# Patient Record
Sex: Female | Born: 1960 | Race: Black or African American | Hispanic: No | Marital: Married | Smoking: Never smoker
Health system: Southern US, Community
[De-identification: ages and names within clinical notes are randomized; demographics above are authoritative.]

## PROBLEM LIST (undated history)

## (undated) DIAGNOSIS — E119 Type 2 diabetes mellitus without complications: Secondary | ICD-10-CM

## (undated) DIAGNOSIS — N6310 Unspecified lump in the right breast, unspecified quadrant: Secondary | ICD-10-CM

## (undated) DIAGNOSIS — I10 Essential (primary) hypertension: Secondary | ICD-10-CM

## (undated) HISTORY — PX: TUBAL LIGATION: SHX77

## (undated) HISTORY — PX: NOVASURE ABLATION: SHX5394

---

## 2018-01-09 ENCOUNTER — Other Ambulatory Visit: Payer: Self-pay | Admitting: Nurse Practitioner

## 2018-01-09 DIAGNOSIS — R928 Other abnormal and inconclusive findings on diagnostic imaging of breast: Secondary | ICD-10-CM

## 2018-01-14 ENCOUNTER — Other Ambulatory Visit: Payer: Self-pay | Admitting: Nurse Practitioner

## 2018-01-15 ENCOUNTER — Other Ambulatory Visit: Payer: Self-pay | Admitting: Nurse Practitioner

## 2018-01-15 DIAGNOSIS — N6489 Other specified disorders of breast: Secondary | ICD-10-CM

## 2018-01-15 DIAGNOSIS — R928 Other abnormal and inconclusive findings on diagnostic imaging of breast: Secondary | ICD-10-CM

## 2018-07-07 ENCOUNTER — Other Ambulatory Visit: Payer: Self-pay | Admitting: Nurse Practitioner

## 2018-07-07 ENCOUNTER — Ambulatory Visit
Admission: RE | Admit: 2018-07-07 | Discharge: 2018-07-07 | Disposition: A | Discharge status: Home / Self Care | Payer: BLUE CROSS/BLUE SHIELD | Source: Ambulatory Visit | Attending: Nurse Practitioner | Admitting: Nurse Practitioner

## 2018-07-07 DIAGNOSIS — N6310 Unspecified lump in the right breast, unspecified quadrant: Secondary | ICD-10-CM

## 2018-07-07 DIAGNOSIS — N6489 Other specified disorders of breast: Secondary | ICD-10-CM

## 2018-07-07 DIAGNOSIS — R928 Other abnormal and inconclusive findings on diagnostic imaging of breast: Secondary | ICD-10-CM

## 2018-07-11 ENCOUNTER — Ambulatory Visit
Admission: RE | Admit: 2018-07-11 | Discharge: 2018-07-11 | Disposition: A | Discharge status: Home / Self Care | Payer: BLUE CROSS/BLUE SHIELD | Source: Ambulatory Visit | Attending: Nurse Practitioner | Admitting: Nurse Practitioner

## 2018-07-11 DIAGNOSIS — N6310 Unspecified lump in the right breast, unspecified quadrant: Secondary | ICD-10-CM

## 2018-08-13 ENCOUNTER — Ambulatory Visit: Payer: Self-pay | Admitting: Surgery

## 2018-08-13 DIAGNOSIS — N6310 Unspecified lump in the right breast, unspecified quadrant: Secondary | ICD-10-CM

## 2018-08-13 NOTE — H&P (Signed)
Daryel November Documented: 08/13/2018 10:41 AM Location: [facility] Patient #: [id number] DOB: [birth year] Married / Language: English / Race: Black or African American Female  History of Present Illness Marcello Moores A. Deana Krock MD; 08/13/2018 11:16 AM) Patient words: Patient sent at the request of Dr. Shelly Bombard of the Breast Ctr[facility] due to a discordant biopsy. The patient had a density in her right breast upper quadrant and followed the last 6 months and [place]. She was sent here for 6 month follow-up in the areas biopsied by ultrasound guidance which showed benign breast tissue but this was felt to be discordant by the radiologist. The patient denies any history of breast pain, nipple discharge or breast mass bilaterally. She has a sister with breast cancer and uterine cancer in her brother with pancreatic cancer she states. Her father died at a very young age she does not history.                ADDENDUM: Pathology results: Pathology results from the ultrasound-guided biopsy of the suspicious mass in the right breast at the 1 o'clock position revealed benign breast tissue. This is considered discordant with the imaging findings.  The patient has been notified of the results. She is doing well and denies any biopsy site complications.  Given the suspicious appearance of the mass in the right breast, surgical excision of this site is recommended. She is scheduled to see Dr. Brantley Stage 07/28/2018 at 10:10 a.m. The patient has been instructed to call the [facility] with any questions or concerns.   Electronically Signed By: Everlean Alstrom M.D. On: 07/14/2018 12:43      Diagnosis Breast, right, needle core biopsy, 1 o'clock - BENIGN BREAST TISSUE. Microscopic Comment A lesion is not seen. The case was called to [facility] on 07/14/2018. Vicente Males MD Pathologist, Electronic Signature (Case signed 07/14/2018) Specimen  Gross and Clinical Information Specimen Comment Time in formalin: 3500; right breast mass Specimen(s) Obtained: Breast, right, needle core biopsy, 1 o'clock Specimen Clinical           58 year old patient presents for follow-up of the right breast. Prior images from [place] including a 2D mammogram and right breast ultrasound dated June 2019.  EXAM: DIGITAL DIAGNOSTIC RIGHT MAMMOGRAM WITH CAD AND TOMO  ULTRASOUND RIGHT BREAST  COMPARISON: June 2019 from [place]  ACR Breast Density Category b: There are scattered areas of fibroglandular density.  FINDINGS: Whole breast CC and MLO views with tomography are performed. There is a focal mass with associated architectural distortion in the upper inner right breast, with adjacent nodularity. In total, this area measures 2 cm.  Mammographic images were processed with CAD.  On physical exam, no mass is palpated in the 1 o'clock position the right breast 7 cm from the nipple.  Targeted ultrasound is performed, showing a heterogeneously hypoechoic irregular mass at 1 o'clock position 7 cm from the nipple measuring 0.6 x 0.8 x 1.2 cm. Ultrasound of the right axilla is negative for lymphadenopathy.  IMPRESSION: Irregular mass with distortion in the 1 o'clock position of the right breast.  RECOMMENDATION: Ultrasound-guided core needle biopsy is recommended and has been scheduled for the patient this Friday July 11, 2018.  I have discussed the findings and recommendations with the patient. Results were also provided in writing at the conclusion of the visit. If applicable, a reminder letter will be sent to the patient regarding the next appointment.  BI-RADS CATEGORY 4: Suspicious.   Electronically Signed By:  Curlene Dolphin M.D. On: 07/07/2018 12:53.  The patient is a 58 year old female.   Past Surgical History Sabino Gasser, [place]; 08/13/2018 10:41 AM) Breast Biopsy  Bilateral. Cesarean Section - 1  Diagnostic Studies History Sabino Gasser, CMA; 08/13/2018 10:41 AM) Colonoscopy 1-5 years ago Mammogram within last year Pap Smear 1-5 years ago  Allergies Sabino Gasser, [place]; 08/13/2018 10:42 AM) Alvina Filbert *ANALGESICS - OPIOID* Allergies Reconciled  Medication History Sabino Gasser, CMA; 08/13/2018 10:42 AM) amLODIPine Besy-Benazepril HCl (10-40MG  Capsule, Oral) Active. Janumet XR (100-1000MG  Tablet ER 24HR, Oral) Active. Medications Reconciled  Social History Sabino Gasser, CMA; 08/13/2018 10:41 AM) Alcohol use Occasional alcohol use. Caffeine use Carbonated beverages, Coffee. No drug use Tobacco use Never smoker.  Family History Sabino Gasser, [place]; 08/13/2018 10:41 AM) Arthritis Mother, Sister. Breast Cancer Sister. Cervical Cancer Sister. Colon Polyps Sister. Depression Sister. Diabetes Mellitus Mother, Sister. Heart Disease Mother. Heart disease in female family member before age 70 Hypertension Brother, Daughter, Mother, Sister, Son. Malignant Neoplasm Of Pancreas Sister.  Pregnancy / Birth History Sabino Gasser, CMA; 08/13/2018 10:41 AM) Age at menarche 31 years. Age of menopause <47 Contraceptive History Oral contraceptives. Gravida 4 Maternal age 67-20 Para 3  Other Problems Sabino Gasser, [place]; 08/13/2018 10:41 AM) Diabetes Mellitus High blood pressure     Review of Systems Sabino Gasser CMA; 08/13/2018 10:41 AM) General Not Present- Appetite Loss, Chills, Fatigue, Fever, Night Sweats, Weight Gain and Weight Loss. Skin Not Present- Change in Wart/Mole, Dryness, Hives, Jaundice, New Lesions, Non-Healing Wounds, Rash and Ulcer. HEENT Present- Wears glasses/contact lenses. Not Present- Earache, Hearing Loss, Hoarseness, Nose Bleed, Oral Ulcers, Ringing in the Ears, Seasonal Allergies, Sinus Pain, Sore Throat, Visual Disturbances and Yellow Eyes. Respiratory Not Present- Bloody sputum, Chronic  Cough, Difficulty Breathing, Snoring and Wheezing. Breast Not Present- Breast Mass, Breast Pain, Nipple Discharge and Skin Changes. Cardiovascular Not Present- Chest Pain, Difficulty Breathing Lying Down, Leg Cramps, Palpitations, Rapid Heart Rate, Shortness of Breath and Swelling of Extremities. Gastrointestinal Not Present- Abdominal Pain, Bloating, Bloody Stool, Change in Bowel Habits, Chronic diarrhea, Constipation, Difficulty Swallowing, Excessive gas, Gets full quickly at meals, Hemorrhoids, Indigestion, Nausea, Rectal Pain and Vomiting. Female Genitourinary Not Present- Frequency, Nocturia, Painful Urination, Pelvic Pain and Urgency. Musculoskeletal Not Present- Back Pain, Joint Pain, Joint Stiffness, Muscle Pain, Muscle Weakness and Swelling of Extremities. Neurological Not Present- Decreased Memory, Fainting, Headaches, Numbness, Seizures, Tingling, Tremor, Trouble walking and Weakness. Psychiatric Not Present- Anxiety, Bipolar, Change in Sleep Pattern, Depression, Fearful and Frequent crying. Endocrine Present- Hot flashes. Not Present- Cold Intolerance, Excessive Hunger, Hair Changes, Heat Intolerance and New Diabetes. Hematology Present- Easy Bruising. Not Present- Blood Thinners, Excessive bleeding, Gland problems, HIV and Persistent Infections.  Vitals Sabino Gasser CMA; 08/13/2018 10:43 AM) 08/13/2018 10:42 AM Weight: 207 lb Height: 62in Body Surface Area: 1.94 m Body Mass Index: 37.86 kg/m  Temp.: 98.41F(Oral)  Pulse: 100 (Regular)  BP: 134/82 (Sitting, Left Arm, Standard)      Physical Exam (Khalaya Mcgurn A. Blayre Papania MD; 08/13/2018 11:16 AM)  General Mental Status-Alert. General Appearance-Consistent with stated age. Hydration-Well hydrated. Voice-Normal.  Head and Neck Head-normocephalic, atraumatic with no lesions or palpable masses. Trachea-midline. Thyroid Gland Characteristics - normal size and consistency.  Chest and Lung Exam Chest and  lung exam reveals -quiet, even and easy respiratory effort with no use of accessory muscles and on auscultation, normal breath sounds, no adventitious sounds and normal vocal resonance. Inspection Chest Wall - Normal. Back - normal.  Breast Breast - Left-Symmetric, Non Tender, No  Biopsy scars, no Dimpling, No Inflammation, No Lumpectomy scars, No Mastectomy scars, No Peau d' Orange. Breast - Right-Symmetric, Non Tender, No Biopsy scars, no Dimpling, No Inflammation, No Lumpectomy scars, No Mastectomy scars, No Peau d' Orange. Breast Lump-No Palpable Breast Mass.  Neurologic Neurologic evaluation reveals -alert and oriented x 3 with no impairment of recent or remote memory. Mental Status-Normal.  Musculoskeletal Normal Exam - Left-Upper Extremity Strength Normal and Lower Extremity Strength Normal. Normal Exam - Right-Upper Extremity Strength Normal and Lower Extremity Strength Normal.  Lymphatic Head & Neck  General Head & Neck Lymphatics: Bilateral - Description - Normal. Axillary  General Axillary Region: Bilateral - Description - Normal. Tenderness - Non Tender.    Assessment & Plan (Ermine Stebbins A. Jentzen Minasyan MD; 08/13/2018 11:17 AM)  SOFT TISSUE MASS (M79.89) Impression: discordant right breast on mammogram Discussed observation in 6 months versus right breast localized lumpectomy.  She opted for lumpectomy.    Risk of lumpectomy include bleeding, infection, seroma, more surgery, use of seed/wire, wound care, cosmetic deformity and the need for other treatments, death , blood clots, death. Pt agrees to proceed. discordant right breast mass  Current Plans Referred to Genetic Counseling, for evaluation and follow up (Medical Genetics). Routine. You are being scheduled for surgery- Our schedulers will call you.  You should hear from our office's scheduling department within 5 working days about the location, date, and time of surgery. We try to make  accommodations for patient's preferences in scheduling surgery, but sometimes the OR schedule or the surgeon's schedule prevents [place] from making those accommodations.  If you have not heard from our office [phone]) in 5 working days, call the office and ask for your surgeon's nurse.  If you have other questions about your diagnosis, plan, or surgery, call the office and ask for your surgeon's nurse.  Pt Education - [facility] Breast Biopsy HCI: discussed with patient and provided information.

## 2018-08-13 NOTE — H&P (View-Only) (Signed)
Cindy May Documented: 08/13/2018 10:41 AM Location: [facility] Patient #: [id number] DOB: 1960-12-07 Married / Language: English / Race: Black or African American Female  History of Present Illness Cindy Moores A. Deverick Pruss May; 08/13/2018 11:16 AM) Patient words: Patient sent at the request of Cindy May of the Breast Ctr[facility] due to a discordant biopsy. The patient had a density in her right breast upper quadrant and followed the last 6 months and [place]. She was sent here for 6 month follow-up in the areas biopsied by ultrasound guidance which showed benign breast tissue but this was felt to be discordant by the radiologist. The patient denies any history of breast pain, nipple discharge or breast mass bilaterally. She has a sister with breast cancer and uterine cancer in her brother with pancreatic cancer she states. Her father died at a very young age she does not history.                ADDENDUM: Pathology results: Pathology results from the ultrasound-guided biopsy of the suspicious mass in the right breast at the 1 o'clock position revealed benign breast tissue. This is considered discordant with the imaging findings.  The patient has been notified of the results. She is doing well and denies any biopsy site complications.  Given the suspicious appearance of the mass in the right breast, surgical excision of this site is recommended. She is scheduled to see Cindy May at 10:10 a.m. The patient has been instructed to call the [facility] with any questions or concerns.   Electronically Signed By: Cindy Alstrom M.D. On: 07/14/2018 12:43      Diagnosis Breast, right, needle core biopsy, 1 o'clock - BENIGN BREAST TISSUE. Microscopic Comment A lesion is not seen. The case was called to [facility] on 07/14/2018. Cindy May Pathologist, Electronic Signature (Case signed 07/14/2018) Specimen  Gross and Clinical Information Specimen Comment Time in formalin: 6160; right breast mass Specimen(s) Obtained: Breast, right, needle core biopsy, 1 o'clock Specimen Clinical           58 year old patient presents for follow-up of the right breast. Prior images from [place] including a 2D mammogram and right breast ultrasound dated June 2019.  EXAM: DIGITAL DIAGNOSTIC RIGHT MAMMOGRAM WITH CAD AND TOMO  ULTRASOUND RIGHT BREAST  COMPARISON: June 2019 from [place]  ACR Breast Density Category b: There are scattered areas of fibroglandular density.  FINDINGS: Whole breast CC and MLO views with tomography are performed. There is a focal mass with associated architectural distortion in the upper inner right breast, with adjacent nodularity. In total, this area measures 2 cm.  Mammographic images were processed with CAD.  On physical exam, no mass is palpated in the 1 o'clock position the right breast 7 cm from the nipple.  Targeted ultrasound is performed, showing a heterogeneously hypoechoic irregular mass at 1 o'clock position 7 cm from the nipple measuring 0.6 x 0.8 x 1.2 cm. Ultrasound of the right axilla is negative for lymphadenopathy.  IMPRESSION: Irregular mass with distortion in the 1 o'clock position of the right breast.  RECOMMENDATION: Ultrasound-guided core needle biopsy is recommended and has been scheduled for the patient this Friday July 11, 2018.  I have discussed the findings and recommendations with the patient. Results were also provided in writing at the conclusion of the visit. If applicable, a reminder letter will be sent to the patient regarding the next appointment.  BI-RADS CATEGORY 4: Suspicious.   Electronically Signed By:  Cindy Dolphin M.D. On: 07/07/2018 12:53.  The patient is a 58 year old female.   Past Surgical History Cindy May; 08/13/2018 10:41 AM) Breast Biopsy  Bilateral. Cesarean Section - 1  Diagnostic Studies History Cindy May; 08/13/2018 10:41 AM) Colonoscopy 1-5 years ago Mammogram within last year Pap Smear 1-5 years ago  Allergies Cindy May; 08/13/2018 10:42 AM) Alvina Filbert *ANALGESICS - OPIOID* Allergies Reconciled  Medication History Cindy May; 08/13/2018 10:42 AM) amLODIPine Besy-Benazepril HCl (10-40MG  Capsule, Oral) Active. Janumet XR (100-1000MG  Tablet ER 24HR, Oral) Active. Medications Reconciled  Social History Cindy May; 08/13/2018 10:41 AM) Alcohol use Occasional alcohol use. Caffeine use Carbonated beverages, Coffee. No drug use Tobacco use Never smoker.  Family History Cindy May; 08/13/2018 10:41 AM) Arthritis Mother, Sister. Breast Cancer Sister. Cervical Cancer Sister. Colon Polyps Sister. Depression Sister. Diabetes Mellitus Mother, Sister. Heart Disease Mother. Heart disease in female family member before age 51 Hypertension Brother, Daughter, Mother, Sister, Son. Malignant Neoplasm Of Pancreas Sister.  Pregnancy / Birth History Cindy May; 08/13/2018 10:41 AM) Age at menarche 70 years. Age of menopause <47 Contraceptive History Oral contraceptives. Gravida 4 Maternal age 35-20 Para 3  Other Problems Cindy May; 08/13/2018 10:41 AM) Diabetes Mellitus High blood pressure     Review of Systems Cindy Gasser May; 08/13/2018 10:41 AM) General Not Present- Appetite Loss, Chills, Fatigue, Fever, Night Sweats, Weight Gain and Weight Loss. Skin Not Present- Change in Wart/Mole, Dryness, Hives, Jaundice, New Lesions, Non-Healing Wounds, Rash and Ulcer. HEENT Present- Wears glasses/contact lenses. Not Present- Earache, Hearing Loss, Hoarseness, Nose Bleed, Oral Ulcers, Ringing in the Ears, Seasonal Allergies, Sinus Pain, Sore Throat, Visual Disturbances and Yellow Eyes. Respiratory Not Present- Bloody sputum, Chronic  Cough, Difficulty Breathing, Snoring and Wheezing. Breast Not Present- Breast Mass, Breast Pain, Nipple Discharge and Skin Changes. Cardiovascular Not Present- Chest Pain, Difficulty Breathing Lying Down, Leg Cramps, Palpitations, Rapid Heart Rate, Shortness of Breath and Swelling of Extremities. Gastrointestinal Not Present- Abdominal Pain, Bloating, Bloody Stool, Change in Bowel Habits, Chronic diarrhea, Constipation, Difficulty Swallowing, Excessive gas, Gets full quickly at meals, Hemorrhoids, Indigestion, Nausea, Rectal Pain and Vomiting. Female Genitourinary Not Present- Frequency, Nocturia, Painful Urination, Pelvic Pain and Urgency. Musculoskeletal Not Present- Back Pain, Joint Pain, Joint Stiffness, Muscle Pain, Muscle Weakness and Swelling of Extremities. Neurological Not Present- Decreased Memory, Fainting, Headaches, Numbness, Seizures, Tingling, Tremor, Trouble walking and Weakness. Psychiatric Not Present- Anxiety, Bipolar, Change in Sleep Pattern, Depression, Fearful and Frequent crying. Endocrine Present- Hot flashes. Not Present- Cold Intolerance, Excessive Hunger, Hair Changes, Heat Intolerance and New Diabetes. Hematology Present- Easy Bruising. Not Present- Blood Thinners, Excessive bleeding, Gland problems, HIV and Persistent Infections.  Vitals Cindy Gasser May; 08/13/2018 10:43 AM) 08/13/2018 10:42 AM Weight: 207 lb Height: 62in Body Surface Area: 1.94 m Body Mass Index: 37.86 kg/m  Temp.: 98.49F(Oral)  Pulse: 100 (Regular)  BP: 134/82 (Sitting, Left Arm, Standard)      Physical Exam (Yulisa Chirico A. Chrishon Martino May; 08/13/2018 11:16 AM)  General Mental Status-Alert. General Appearance-Consistent with stated age. Hydration-Well hydrated. Voice-Normal.  Head and Neck Head-normocephalic, atraumatic with no lesions or palpable masses. Trachea-midline. Thyroid Gland Characteristics - normal size and consistency.  Chest and Lung Exam Chest and  lung exam reveals -quiet, even and easy respiratory effort with no use of accessory muscles and on auscultation, normal breath sounds, no adventitious sounds and normal vocal resonance. Inspection Chest Wall - Normal. Back - normal.  Breast Breast - Left-Symmetric, Non Tender, No  Biopsy scars, no Dimpling, No Inflammation, No Lumpectomy scars, No Mastectomy scars, No Peau d' Orange. Breast - Right-Symmetric, Non Tender, No Biopsy scars, no Dimpling, No Inflammation, No Lumpectomy scars, No Mastectomy scars, No Peau d' Orange. Breast Lump-No Palpable Breast Mass.  Neurologic Neurologic evaluation reveals -alert and oriented x 3 with no impairment of recent or remote memory. Mental Status-Normal.  Musculoskeletal Normal Exam - Left-Upper Extremity Strength Normal and Lower Extremity Strength Normal. Normal Exam - Right-Upper Extremity Strength Normal and Lower Extremity Strength Normal.  Lymphatic Head & Neck  General Head & Neck Lymphatics: Bilateral - Description - Normal. Axillary  General Axillary Region: Bilateral - Description - Normal. Tenderness - Non Tender.    Assessment & Plan (Briyonna Omara A. Orlander [place] May; 08/13/2018 11:17 AM)  SOFT TISSUE MASS (M79.89) Impression: discordant right breast on mammogram Discussed observation in 6 months versus right breast localized lumpectomy.  She opted for lumpectomy.    Risk of lumpectomy include bleeding, infection, seroma, more surgery, use of seed/wire, wound care, cosmetic deformity and the need for other treatments, death , blood clots, death. Pt agrees to proceed. discordant right breast mass  Current Plans Referred to Genetic Counseling, for evaluation and follow up (Medical Genetics). Routine. You are being scheduled for surgery- Our schedulers will call you.  You should hear from our office's scheduling department within 5 working days about the location, date, and time of surgery. We try to make  accommodations for patient's preferences in scheduling surgery, but sometimes the OR schedule or the surgeon's schedule prevents [place] from making those accommodations.  If you have not heard from our office [phone]) in 5 working days, call the office and ask for your surgeon's nurse.  If you have other questions about your diagnosis, plan, or surgery, call the office and ask for your surgeon's nurse.  Pt Education - [facility] Breast Biopsy HCI: discussed with patient and provided information.

## 2018-08-14 ENCOUNTER — Other Ambulatory Visit: Payer: Self-pay | Admitting: Surgery

## 2018-08-14 DIAGNOSIS — N6310 Unspecified lump in the right breast, unspecified quadrant: Secondary | ICD-10-CM

## 2018-09-01 ENCOUNTER — Encounter (HOSPITAL_BASED_OUTPATIENT_CLINIC_OR_DEPARTMENT_OTHER): Payer: Self-pay | Admitting: *Deleted

## 2018-09-01 ENCOUNTER — Other Ambulatory Visit: Payer: Self-pay

## 2018-09-02 ENCOUNTER — Encounter (HOSPITAL_BASED_OUTPATIENT_CLINIC_OR_DEPARTMENT_OTHER)
Admission: RE | Admit: 2018-09-02 | Discharge: 2018-09-02 | Disposition: A | Discharge status: Home / Self Care | Payer: BLUE CROSS/BLUE SHIELD | Source: Ambulatory Visit | Attending: Surgery | Admitting: Surgery

## 2018-09-02 DIAGNOSIS — Z01818 Encounter for other preprocedural examination: Secondary | ICD-10-CM | POA: Insufficient documentation

## 2018-09-02 LAB — BASIC METABOLIC PANEL
Anion gap: 9 (ref 5–15)
BUN: 8 mg/dL (ref 6–20)
CHLORIDE: 107 mmol/L (ref 98–111)
CO2: 25 mmol/L (ref 22–32)
Calcium: 9.5 mg/dL (ref 8.9–10.3)
Creatinine, Ser: 1 mg/dL (ref 0.44–1.00)
GFR calc Af Amer: >60 mL/min (ref >60)
GFR calc non Af Amer: >60 mL/min (ref >60)
Glucose, Bld: 101 mg/dL — ABNORMAL HIGH (ref 70–99)
Potassium: 4 mmol/L (ref 3.5–5.1)
Sodium: 141 mmol/L (ref 135–145)

## 2018-09-02 NOTE — Progress Notes (Signed)
EKG reviewed with Dr Tobias Alexander. Ok to proceed with surgery as scheduled. Ensure pre surgical drink given to pt with instructions to finish by 0415 DOS. Hibiclens soap given to pt with instructions. Verbalized understanding.

## 2018-09-08 ENCOUNTER — Ambulatory Visit
Admission: RE | Admit: 2018-09-08 | Discharge: 2018-09-08 | Disposition: A | Discharge status: Home / Self Care | Payer: BLUE CROSS/BLUE SHIELD | Source: Ambulatory Visit | Attending: Surgery | Admitting: Surgery

## 2018-09-08 DIAGNOSIS — N6310 Unspecified lump in the right breast, unspecified quadrant: Secondary | ICD-10-CM

## 2018-09-08 NOTE — Anesthesia Preprocedure Evaluation (Addendum)
Anesthesia Evaluation  Patient identified by MRN, date of birth, ID band Patient awake    Reviewed: Allergy & Precautions, NPO status , Patient's Chart, lab work & pertinent test results  History of Anesthesia Complications Negative for: history of anesthetic complications  Airway Mallampati: II  TM Distance: >3 FB Neck ROM: Full    Dental  (+) Dental Advisory Given, Partial Upper   Pulmonary neg pulmonary ROS,    Pulmonary exam normal breath sounds clear to auscultation       Cardiovascular hypertension, Pt. on medications Normal cardiovascular exam Rhythm:Regular Rate:Normal     Neuro/Psych negative neurological ROS     GI/Hepatic negative GI ROS, Neg liver ROS,   Endo/Other  diabetes, Type 2, Oral Hypoglycemic Agents  Renal/GU negative Renal ROS     Musculoskeletal negative musculoskeletal ROS (+)   Abdominal   Peds  Hematology negative hematology ROS (+)   Anesthesia Other Findings Day of surgery medications reviewed with the patient.  Reproductive/Obstetrics                            Anesthesia Physical Anesthesia Plan  ASA: III  Anesthesia Plan: General   Post-op Pain Management:    Induction: Intravenous  PONV Risk Score and Plan: 3 and Treatment may vary due to age or medical condition, Ondansetron, Dexamethasone and Midazolam  Airway Management Planned: LMA  Additional Equipment:   Intra-op Plan:   Post-operative Plan: Extubation in OR  Informed Consent: I have reviewed the patients History and Physical, chart, labs and discussed the procedure including the risks, benefits and alternatives for the proposed anesthesia with the patient or authorized representative who has indicated his/her understanding and acceptance.     Dental advisory given  Plan Discussed with: CRNA  Anesthesia Plan Comments:        Anesthesia Quick Evaluation

## 2018-09-09 ENCOUNTER — Encounter (HOSPITAL_BASED_OUTPATIENT_CLINIC_OR_DEPARTMENT_OTHER): Payer: Self-pay | Admitting: *Deleted

## 2018-09-09 ENCOUNTER — Ambulatory Visit (HOSPITAL_BASED_OUTPATIENT_CLINIC_OR_DEPARTMENT_OTHER): Payer: BLUE CROSS/BLUE SHIELD | Admitting: Anesthesiology

## 2018-09-09 ENCOUNTER — Encounter (HOSPITAL_BASED_OUTPATIENT_CLINIC_OR_DEPARTMENT_OTHER)
Admission: RE | Disposition: A | Discharge status: Home / Self Care | Payer: Self-pay | Source: Home / Self Care | Attending: Surgery

## 2018-09-09 ENCOUNTER — Ambulatory Visit (HOSPITAL_BASED_OUTPATIENT_CLINIC_OR_DEPARTMENT_OTHER)
Admission: RE | Admit: 2018-09-09 | Discharge: 2018-09-09 | Disposition: A | Discharge status: Home / Self Care | Payer: BLUE CROSS/BLUE SHIELD | Attending: Surgery | Admitting: Surgery

## 2018-09-09 ENCOUNTER — Other Ambulatory Visit: Payer: Self-pay

## 2018-09-09 ENCOUNTER — Ambulatory Visit
Admission: RE | Admit: 2018-09-09 | Discharge: 2018-09-09 | Disposition: A | Discharge status: Home / Self Care | Payer: BLUE CROSS/BLUE SHIELD | Source: Ambulatory Visit | Attending: Surgery | Admitting: Surgery

## 2018-09-09 DIAGNOSIS — E119 Type 2 diabetes mellitus without complications: Secondary | ICD-10-CM | POA: Diagnosis not present

## 2018-09-09 DIAGNOSIS — N6011 Diffuse cystic mastopathy of right breast: Secondary | ICD-10-CM | POA: Insufficient documentation

## 2018-09-09 DIAGNOSIS — I10 Essential (primary) hypertension: Secondary | ICD-10-CM | POA: Diagnosis not present

## 2018-09-09 DIAGNOSIS — N6310 Unspecified lump in the right breast, unspecified quadrant: Secondary | ICD-10-CM

## 2018-09-09 DIAGNOSIS — Z79899 Other long term (current) drug therapy: Secondary | ICD-10-CM | POA: Insufficient documentation

## 2018-09-09 DIAGNOSIS — D241 Benign neoplasm of right breast: Secondary | ICD-10-CM | POA: Insufficient documentation

## 2018-09-09 DIAGNOSIS — Z885 Allergy status to narcotic agent status: Secondary | ICD-10-CM | POA: Diagnosis not present

## 2018-09-09 DIAGNOSIS — Z7984 Long term (current) use of oral hypoglycemic drugs: Secondary | ICD-10-CM | POA: Insufficient documentation

## 2018-09-09 DIAGNOSIS — N6312 Unspecified lump in the right breast, upper inner quadrant: Secondary | ICD-10-CM | POA: Diagnosis present

## 2018-09-09 HISTORY — DX: Essential (primary) hypertension: I10

## 2018-09-09 HISTORY — DX: Unspecified lump in the right breast, unspecified quadrant: N63.10

## 2018-09-09 HISTORY — PX: BREAST LUMPECTOMY WITH RADIOACTIVE SEED LOCALIZATION: SHX6424

## 2018-09-09 HISTORY — DX: Type 2 diabetes mellitus without complications: E11.9

## 2018-09-09 LAB — GLUCOSE, CAPILLARY
Glucose-Capillary: 124 mg/dL — ABNORMAL HIGH (ref 70–99)
Glucose-Capillary: 105 mg/dL — ABNORMAL HIGH (ref 70–99)

## 2018-09-09 SURGERY — BREAST LUMPECTOMY WITH RADIOACTIVE SEED LOCALIZATION
Anesthesia: General | Site: Breast | Laterality: Right

## 2018-09-09 MED ORDER — DEXAMETHASONE SODIUM PHOSPHATE 10 MG/ML IJ SOLN
INTRAMUSCULAR | Status: DC | PRN
  Administered 2018-09-09: 4 mg via INTRAVENOUS

## 2018-09-09 MED ORDER — CHLORHEXIDINE GLUCONATE CLOTH 2 % EX PADS: 6.0000 | MEDICATED_PAD | Freq: Once | CUTANEOUS | Status: DC

## 2018-09-09 MED ORDER — PROPOFOL 10 MG/ML IV BOLUS
INTRAVENOUS | Status: DC | PRN
  Administered 2018-09-09: 180 mg via INTRAVENOUS

## 2018-09-09 MED ORDER — ACETAMINOPHEN 500 MG PO TABS
1000.0000 mg | ORAL_TABLET | ORAL | Status: AC
  Administered 2018-09-09: 1000 mg via ORAL

## 2018-09-09 MED ORDER — GABAPENTIN 300 MG PO CAPS
ORAL_CAPSULE | ORAL | Status: AC
  Filled 2018-09-09: qty 1

## 2018-09-09 MED ORDER — ONDANSETRON HCL 4 MG/2ML IJ SOLN
INTRAMUSCULAR | Status: AC
  Filled 2018-09-09: qty 2

## 2018-09-09 MED ORDER — ACETAMINOPHEN 10 MG/ML IV SOLN: 1000.0000 mg | Freq: Once | INTRAVENOUS | Status: DC | PRN

## 2018-09-09 MED ORDER — CELECOXIB 200 MG PO CAPS
200.0000 mg | ORAL_CAPSULE | ORAL | Status: AC
  Administered 2018-09-09: 200 mg via ORAL

## 2018-09-09 MED ORDER — LIDOCAINE 2% (20 MG/ML) 5 ML SYRINGE
INTRAMUSCULAR | Status: AC
  Filled 2018-09-09: qty 5

## 2018-09-09 MED ORDER — FENTANYL CITRATE (PF) 100 MCG/2ML IJ SOLN
INTRAMUSCULAR | Status: AC
  Filled 2018-09-09: qty 2

## 2018-09-09 MED ORDER — MIDAZOLAM HCL 2 MG/2ML IJ SOLN
1.0000 mg | INTRAMUSCULAR | Status: DC | PRN
  Administered 2018-09-09: 2 mg via INTRAVENOUS

## 2018-09-09 MED ORDER — OXYCODONE HCL 5 MG/5ML PO SOLN: 5.0000 mg | Freq: Once | ORAL | Status: DC | PRN

## 2018-09-09 MED ORDER — ACETAMINOPHEN 500 MG PO TABS
ORAL_TABLET | ORAL | Status: AC
  Filled 2018-09-09: qty 2

## 2018-09-09 MED ORDER — DEXTROSE 5 % IV SOLN: 3.0000 g | INTRAVENOUS | Status: DC

## 2018-09-09 MED ORDER — CELECOXIB 200 MG PO CAPS
ORAL_CAPSULE | ORAL | Status: AC
  Filled 2018-09-09: qty 1

## 2018-09-09 MED ORDER — OXYCODONE HCL 5 MG PO TABS: 5.0000 mg | ORAL_TABLET | Freq: Once | ORAL | Status: DC | PRN

## 2018-09-09 MED ORDER — MIDAZOLAM HCL 2 MG/2ML IJ SOLN
INTRAMUSCULAR | Status: AC
  Filled 2018-09-09: qty 2

## 2018-09-09 MED ORDER — PROPOFOL 500 MG/50ML IV EMUL
INTRAVENOUS | Status: AC
  Filled 2018-09-09: qty 50

## 2018-09-09 MED ORDER — CEFAZOLIN SODIUM-DEXTROSE 2-4 GM/100ML-% IV SOLN
2.0000 g | Freq: Once | INTRAVENOUS | Status: AC
  Administered 2018-09-09: 2 g via INTRAVENOUS

## 2018-09-09 MED ORDER — ONDANSETRON HCL 4 MG/2ML IJ SOLN
INTRAMUSCULAR | Status: DC | PRN
  Administered 2018-09-09: 4 mg via INTRAVENOUS

## 2018-09-09 MED ORDER — LIDOCAINE HCL (CARDIAC) PF 100 MG/5ML IV SOSY
PREFILLED_SYRINGE | INTRAVENOUS | Status: DC | PRN
  Administered 2018-09-09: 100 mg via INTRAVENOUS

## 2018-09-09 MED ORDER — FENTANYL CITRATE (PF) 100 MCG/2ML IJ SOLN: 25.0000 ug | INTRAMUSCULAR | Status: DC | PRN

## 2018-09-09 MED ORDER — IBUPROFEN 800 MG PO TABS: 800.0000 mg | ORAL_TABLET | Freq: Three times a day (TID) | ORAL | 0 refills | Status: AC | PRN

## 2018-09-09 MED ORDER — LACTATED RINGERS IV SOLN
INTRAVENOUS | Status: DC
  Administered 2018-09-09: 07:00:00 via INTRAVENOUS

## 2018-09-09 MED ORDER — DEXAMETHASONE SODIUM PHOSPHATE 10 MG/ML IJ SOLN
INTRAMUSCULAR | Status: AC
  Filled 2018-09-09: qty 1

## 2018-09-09 MED ORDER — BUPIVACAINE-EPINEPHRINE (PF) 0.25% -1:200000 IJ SOLN
INTRAMUSCULAR | Status: AC
  Filled 2018-09-09: qty 150

## 2018-09-09 MED ORDER — SCOPOLAMINE 1 MG/3DAYS TD PT72: 1.0000 | MEDICATED_PATCH | Freq: Once | TRANSDERMAL | Status: DC | PRN

## 2018-09-09 MED ORDER — FENTANYL CITRATE (PF) 100 MCG/2ML IJ SOLN
50.0000 ug | INTRAMUSCULAR | Status: DC | PRN
  Administered 2018-09-09 (×2): 50 ug via INTRAVENOUS

## 2018-09-09 MED ORDER — BUPIVACAINE HCL (PF) 0.25 % IJ SOLN
INTRAMUSCULAR | Status: AC
  Filled 2018-09-09: qty 30

## 2018-09-09 MED ORDER — BUPIVACAINE-EPINEPHRINE (PF) 0.25% -1:200000 IJ SOLN
INTRAMUSCULAR | Status: DC | PRN
  Administered 2018-09-09: 30 mL

## 2018-09-09 MED ORDER — PROMETHAZINE HCL 25 MG/ML IJ SOLN: 6.2500 mg | INTRAMUSCULAR | Status: DC | PRN

## 2018-09-09 MED ORDER — GABAPENTIN 300 MG PO CAPS
300.0000 mg | ORAL_CAPSULE | ORAL | Status: AC
  Administered 2018-09-09: 300 mg via ORAL

## 2018-09-09 MED ORDER — CEFAZOLIN SODIUM-DEXTROSE 2-4 GM/100ML-% IV SOLN
INTRAVENOUS | Status: AC
  Filled 2018-09-09: qty 100

## 2018-09-09 SURGICAL SUPPLY — 55 items
APPLIER CLIP 9.375 MED OPEN (MISCELLANEOUS)
BINDER BREAST LRG (GAUZE/BANDAGES/DRESSINGS) IMPLANT
BINDER BREAST MEDIUM (GAUZE/BANDAGES/DRESSINGS) IMPLANT
BINDER BREAST XLRG (GAUZE/BANDAGES/DRESSINGS) IMPLANT
BINDER BREAST XXLRG (GAUZE/BANDAGES/DRESSINGS) ×3 IMPLANT
BLADE SURG 15 STRL LF DISP TIS (BLADE) ×1 IMPLANT
BLADE SURG 15 STRL SS (BLADE) ×2
CANISTER SUC SOCK COL 7IN (MISCELLANEOUS) IMPLANT
CANISTER SUCT 1200ML W/VALVE (MISCELLANEOUS) IMPLANT
CHLORAPREP W/TINT 26ML (MISCELLANEOUS) ×3 IMPLANT
CLIP APPLIE 9.375 MED OPEN (MISCELLANEOUS) IMPLANT
COVER BACK TABLE 60X90IN (DRAPES) ×3 IMPLANT
COVER MAYO STAND STRL (DRAPES) ×3 IMPLANT
COVER PROBE W GEL 5X96 (DRAPES) ×3 IMPLANT
COVER WAND RF STERILE (DRAPES) IMPLANT
DECANTER SPIKE VIAL GLASS SM (MISCELLANEOUS) IMPLANT
DERMABOND ADVANCED (GAUZE/BANDAGES/DRESSINGS) ×2
DERMABOND ADVANCED .7 DNX12 (GAUZE/BANDAGES/DRESSINGS) ×1 IMPLANT
DRAPE LAPAROSCOPIC ABDOMINAL (DRAPES) IMPLANT
DRAPE LAPAROTOMY 100X72 PEDS (DRAPES) ×3 IMPLANT
DRAPE UTILITY XL STRL (DRAPES) ×3 IMPLANT
ELECT COATED BLADE 2.86 ST (ELECTRODE) ×3 IMPLANT
ELECT REM PT RETURN 9FT ADLT (ELECTROSURGICAL) ×3
ELECTRODE REM PT RTRN 9FT ADLT (ELECTROSURGICAL) ×1 IMPLANT
GLOVE BIO SURGEON STRL SZ 6.5 (GLOVE) ×2 IMPLANT
GLOVE BIO SURGEON STRL SZ7 (GLOVE) ×3 IMPLANT
GLOVE BIO SURGEONS STRL SZ 6.5 (GLOVE) ×1
GLOVE BIOGEL PI IND STRL 7.5 (GLOVE) ×1 IMPLANT
GLOVE BIOGEL PI IND STRL 8 (GLOVE) ×1 IMPLANT
GLOVE BIOGEL PI INDICATOR 7.5 (GLOVE) ×2
GLOVE BIOGEL PI INDICATOR 8 (GLOVE) ×2
GLOVE ECLIPSE 8.0 STRL XLNG CF (GLOVE) ×3 IMPLANT
GLOVE EXAM NITRILE MD LF STRL (GLOVE) ×3 IMPLANT
GOWN STRL REUS W/ TWL LRG LVL3 (GOWN DISPOSABLE) ×2 IMPLANT
GOWN STRL REUS W/ TWL XL LVL3 (GOWN DISPOSABLE) ×1 IMPLANT
GOWN STRL REUS W/TWL LRG LVL3 (GOWN DISPOSABLE) ×4
GOWN STRL REUS W/TWL XL LVL3 (GOWN DISPOSABLE) ×2
HEMOSTAT ARISTA ABSORB 3G PWDR (HEMOSTASIS) IMPLANT
HEMOSTAT SNOW SURGICEL 2X4 (HEMOSTASIS) IMPLANT
KIT MARKER MARGIN INK (KITS) ×3 IMPLANT
NEEDLE HYPO 25X1 1.5 SAFETY (NEEDLE) ×3 IMPLANT
NS IRRIG 1000ML POUR BTL (IV SOLUTION) ×3 IMPLANT
PACK BASIN DAY SURGERY FS (CUSTOM PROCEDURE TRAY) ×3 IMPLANT
PENCIL BUTTON HOLSTER BLD 10FT (ELECTRODE) ×3 IMPLANT
SLEEVE SCD COMPRESS KNEE MED (MISCELLANEOUS) ×3 IMPLANT
SPONGE LAP 4X18 RFD (DISPOSABLE) ×6 IMPLANT
SUT MNCRL AB 4-0 PS2 18 (SUTURE) ×3 IMPLANT
SUT SILK 2 0 SH (SUTURE) IMPLANT
SUT VICRYL 3-0 CR8 SH (SUTURE) ×3 IMPLANT
SYR CONTROL 10ML LL (SYRINGE) ×3 IMPLANT
TOWEL GREEN STERILE FF (TOWEL DISPOSABLE) ×3 IMPLANT
TRAY FAXITRON CT DISP (TRAY / TRAY PROCEDURE) ×3 IMPLANT
TUBE CONNECTING 20'X1/4 (TUBING)
TUBE CONNECTING 20X1/4 (TUBING) IMPLANT
YANKAUER SUCT BULB TIP NO VENT (SUCTIONS) IMPLANT

## 2018-09-09 NOTE — Interval H&P Note (Signed)
History and Physical Interval Note:  09/09/2018 7:16 AM  Cindy May  has presented today for surgery, with the diagnosis of right breast mass  The various methods of treatment have been discussed with the patient and family. After consideration of risks, benefits and other options for treatment, the patient has consented to  Procedure(s): RIGHT BREAST LUMPECTOMY WITH RADIOACTIVE SEED LOCALIZATION ERAS PATHWAY (Right) as a surgical intervention .  The patient's history has been reviewed, patient examined, no change in status, stable for surgery.  I have reviewed the patient's chart and labs.  Questions were answered to the patient's satisfaction.     [place]

## 2018-09-09 NOTE — Anesthesia Procedure Notes (Signed)
Procedure Name: LMA Insertion Date/Time: 09/09/2018 7:35 AM Performed by: Jonna Munro, CRNA Pre-anesthesia Checklist: Patient identified, Emergency Drugs available, Suction available, Patient being monitored and Timeout performed Patient Re-evaluated:Patient Re-evaluated prior to induction Oxygen Delivery Method: Circle system utilized Preoxygenation: Pre-oxygenation with 100% oxygen Induction Type: IV induction LMA: LMA inserted LMA Size: 4.0 Number of attempts: 1 Tube secured with: Tape Dental Injury: Teeth and Oropharynx as per pre-operative assessment

## 2018-09-09 NOTE — Op Note (Signed)
Preoperative diagnosis: Right breast mass  Postoperative size: Same  Procedure: Right breast seed localized lumpectomy  Surgeon: Jerelene Redden MD  Anesthesia: LMA with local consisting of 0.25% Sensorcaine with epinephrine  EBL: 20 cc  Specimen: Right breast tissue with seed and clip verified by Faxitron  Drains: None  IV fluids: Per anesthesia record  Indications for procedure: The patient is a 58 year old female with an abnormal screening right breast mammogram.  Core biopsy showed fibrocystic type changes but this was felt to be discordant to the imaging.  Lumpectomy was recommended for further evaluation.The procedure has been discussed with the patient. Alternatives to surgery have been discussed with the patient.  Risks of surgery include bleeding,  Infection,  Seroma formation, death,  and the need for further surgery.   The patient understands and wishes to proceed.The procedure has been discussed with the patient. Alternatives to surgery have been discussed with the patient.  Risks of surgery include bleeding,  Infection,  Seroma formation, death,  and the need for further surgery.   The patient understands and wishes to proceed.    Description of procedure: The patient was met in the holding area.  Neoprobe used to verify location of seed and clip.  Questions were answered and the breast was marked on the right side is correct.  She was taken back to the operating room.  She was placed supine upon the operating table.  After induction of LMA anesthesia, she was prepped and draped in sterile fashion.  Timeout was done.  She received appropriate preoperative antibiotics.  Neoprobe was used to identify the seed in the central upper central right breast.  Incision was made after infiltration of local anesthetic over the hotspot.  Dissection was carried down all tissue around the seed clip were excised with grossly negative margin.  Irrigation was used and the wound was made hemostatic.   Local anesthetic was infiltrated throughout the cavity.  Faxitron image revealed both the seed and clip to be in the specimen.  This was sent to pathology and verified.  The wound was then closed with 3-0 Vicryl and 4-0 Monocryl.  Dermabond applied.  All final counts found to be correct sponge, needles and instruments.  Breast binder placed.  Patient was awoke extubated taken to recovery in satisfactory condition.

## 2018-09-09 NOTE — Anesthesia Postprocedure Evaluation (Signed)
Anesthesia Post Note  Patient: Cindy May  Procedure(s) Performed: RIGHT BREAST LUMPECTOMY WITH RADIOACTIVE SEED LOCALIZATION ERAS PATHWAY (Right Breast)     Patient location during evaluation: PACU Anesthesia Type: General Level of consciousness: awake and alert Pain management: pain level controlled Vital Signs Assessment: post-procedure vital signs reviewed and stable Respiratory status: spontaneous breathing, nonlabored ventilation and respiratory function stable Cardiovascular status: blood pressure returned to baseline and stable Postop Assessment: no apparent nausea or vomiting Anesthetic complications: no    Last Vitals:  Vitals:   09/09/18 0900 09/09/18 0918  BP: 119/67 121/69  Pulse: 81 79  Resp: 13 16  Temp:  36.6 C  SpO2: 100% 100%    Last Pain:  Vitals:   09/09/18 0918  TempSrc: Oral  PainSc: 0-No pain                 Brennan Bailey

## 2018-09-09 NOTE — Discharge Instructions (Signed)
[facility] °Office Phone Number [phone] ° °BREAST BIOPSY/ PARTIAL MASTECTOMY: POST OP INSTRUCTIONS ° °Always review your discharge instruction sheet given to you by the facility where your surgery was performed. ° °IF YOU HAVE DISABILITY OR FAMILY LEAVE FORMS, YOU MUST BRING THEM TO THE OFFICE FOR PROCESSING.  DO NOT GIVE THEM TO YOUR DOCTOR. ° °1. A prescription for pain medication may be given to you upon discharge.  Take your pain medication as prescribed, if needed.  If narcotic pain medicine is not needed, then you may take acetaminophen (Tylenol) or ibuprofen (Advil) as needed. °2. Take your usually prescribed medications unless otherwise directed °3. If you need a refill on your pain medication, please contact your pharmacy.  They will contact our office to request authorization.  Prescriptions will not be filled after 5pm or on week-ends. °4. You should eat very light the first 24 hours after surgery, such as soup, crackers, pudding, etc.  Resume your normal diet the day after surgery. °5. Most patients will experience some swelling and bruising in the breast.  Ice packs and a good support bra will help.  Swelling and bruising can take several days to resolve.  °6. It is common to experience some constipation if taking pain medication after surgery.  Increasing fluid intake and taking a stool softener will usually help or prevent this problem from occurring.  A mild laxative (Milk of Magnesia or Miralax) should be taken according to package directions if there are no bowel movements after 48 hours. °7. Unless discharge instructions indicate otherwise, you may remove your bandages 24-48 hours after surgery, and you may shower at that time.  You may have steri-strips (small skin tapes) in place directly over the incision.  These strips should be left on the skin for 7-10 days.  If your surgeon used skin glue on the incision, you may shower in 24 hours.  The glue will flake off over the  next 2-3 weeks.  Any sutures or staples will be removed at the office during your follow-up visit. °8. ACTIVITIES:  You may resume regular daily activities (gradually increasing) beginning the next day.  Wearing a good support bra or sports bra minimizes pain and swelling.  You may have sexual intercourse when it is comfortable. °a. You may drive when you no longer are taking prescription pain medication, you can comfortably wear a seatbelt, and you can safely maneuver your car and apply brakes. °b. RETURN TO WORK:  ______________________________________________________________________________________ °9. You should see your doctor in the office for a follow-up appointment approximately two weeks after your surgery.  Your doctor’s nurse will typically make your follow-up appointment when she calls you with your pathology report.  Expect your pathology report 2-3 business days after your surgery.  You may call to check if you do not hear from us after three days. °10. OTHER INSTRUCTIONS: _______________________________________________________________________________________________ _____________________________________________________________________________________________________________________________________ °_____________________________________________________________________________________________________________________________________ °_____________________________________________________________________________________________________________________________________ ° °WHEN TO CALL YOUR DOCTOR: °1. Fever over 101.0 °2. Nausea and/or vomiting. °3. Extreme swelling or bruising. °4. Continued bleeding from incision. °5. Increased pain, redness, or drainage from the incision. ° °The clinic staff is available to answer your questions during regular business hours.  Please don’t hesitate to call and ask to speak to one of the nurses for clinical concerns.  If you have a medical emergency, go to the nearest  emergency room or call 911.  A surgeon from [facility] is always on call at the hospital. ° °For further questions, please visit [email or web address]  ° ° ° °  Post Anesthesia Home Care Instructions  NO TYLENOL UNTIL AFTER 1:00 PM TODAY.   Activity: Get plenty of rest for the remainder of the day. A responsible individual must stay with you for 24 hours following the procedure.  For the next 24 hours, DO NOT: -Drive a car -Paediatric nurse -Drink alcoholic beverages -Take any medication unless instructed by your physician -Make any legal decisions or sign important papers.  Meals: Start with liquid foods such as gelatin or soup. Progress to regular foods as tolerated. Avoid greasy, spicy, heavy foods. If nausea and/or vomiting occur, drink only clear liquids until the nausea and/or vomiting subsides. Call your physician if vomiting continues.  Special Instructions/Symptoms: Your throat may feel dry or sore from the anesthesia or the breathing tube placed in your throat during surgery. If this causes discomfort, gargle with warm salt water. The discomfort should disappear within 24 hours.  If you had a scopolamine patch placed behind your ear for the management of post- operative nausea and/or vomiting:  1. The medication in the patch is effective for 72 hours, after which it should be removed.  Wrap patch in a tissue and discard in the trash. Wash hands thoroughly with soap and water. 2. You may remove the patch earlier than 72 hours if you experience unpleasant side effects which may include dry mouth, dizziness or visual disturbances. 3. Avoid touching the patch. Wash your hands with soap and water after contact with the patch.

## 2018-09-09 NOTE — Transfer of Care (Signed)
Immediate Anesthesia Transfer of Care Note  Patient: Cindy May  Procedure(s) Performed: RIGHT BREAST LUMPECTOMY WITH RADIOACTIVE SEED LOCALIZATION ERAS PATHWAY (Right Breast)  Patient Location: PACU  Anesthesia Type:General  Level of Consciousness: awake, alert  and oriented  Airway & Oxygen Therapy: Patient Spontanous Breathing and Patient connected to face mask oxygen  Post-op Assessment: Report given to RN and Post -op Vital signs reviewed and stable  Post vital signs: Reviewed and stable  Last Vitals:  Vitals Value Taken Time  BP    Temp    Pulse    Resp    SpO2      Last Pain:  Vitals:   09/09/18 0631  TempSrc: Oral  PainSc: 0-No pain         Complications: No apparent anesthesia complications

## 2018-09-10 ENCOUNTER — Encounter (HOSPITAL_BASED_OUTPATIENT_CLINIC_OR_DEPARTMENT_OTHER): Payer: Self-pay | Admitting: Surgery

## 2019-04-13 IMAGING — MG MM PLC BREAST LOC DEV 1ST LESION INC*R*
8 series · 8 of 8 positions shown · non-contrast
Comparison: Previous exam(s).

CLINICAL DATA: The patient presents for seed localization prior to
excision of a lesion in the UPPER INNER QUADRANT of the RIGHT breast
which showed discordant, benign results.

EXAM:
MAMMOGRAPHIC GUIDED RADIOACTIVE SEED LOCALIZATION OF THE RIGHT
BREAST

[R CC (1 of 3)]
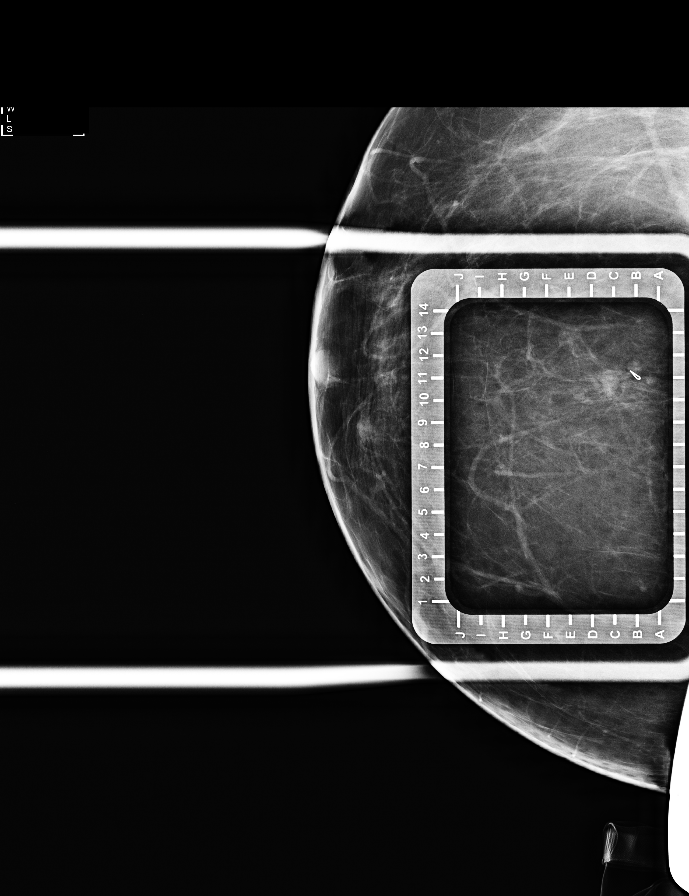

[R ML (1 of 5)]
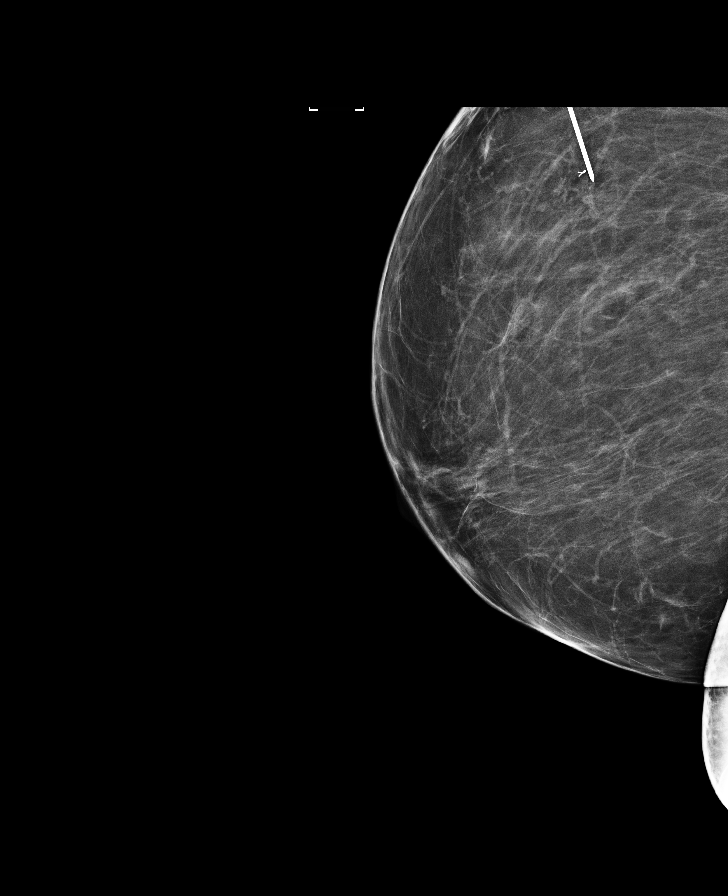

[R ML (2 of 5)]
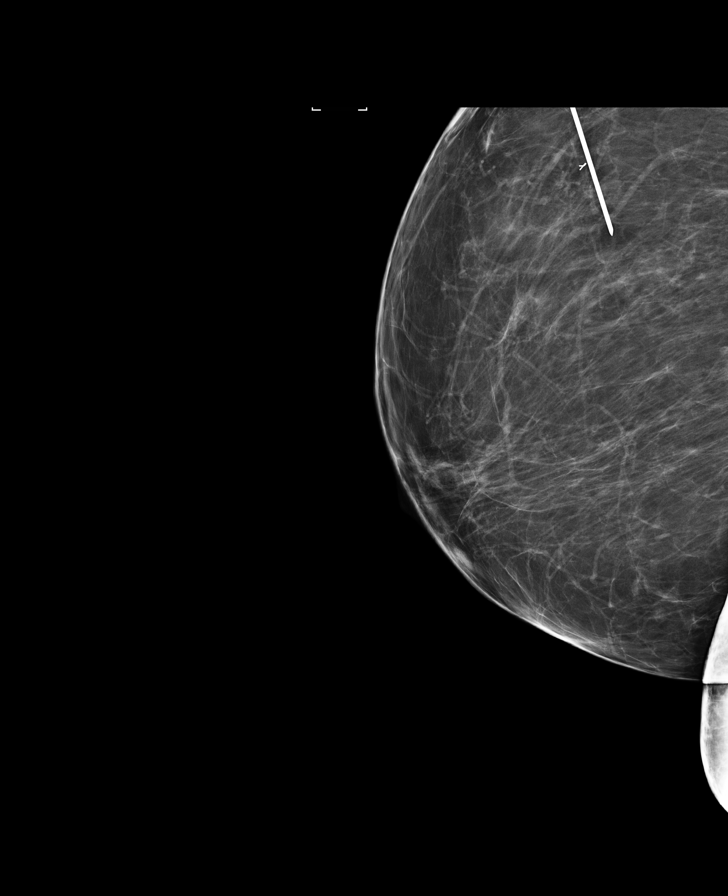

[R CC (2 of 3)]
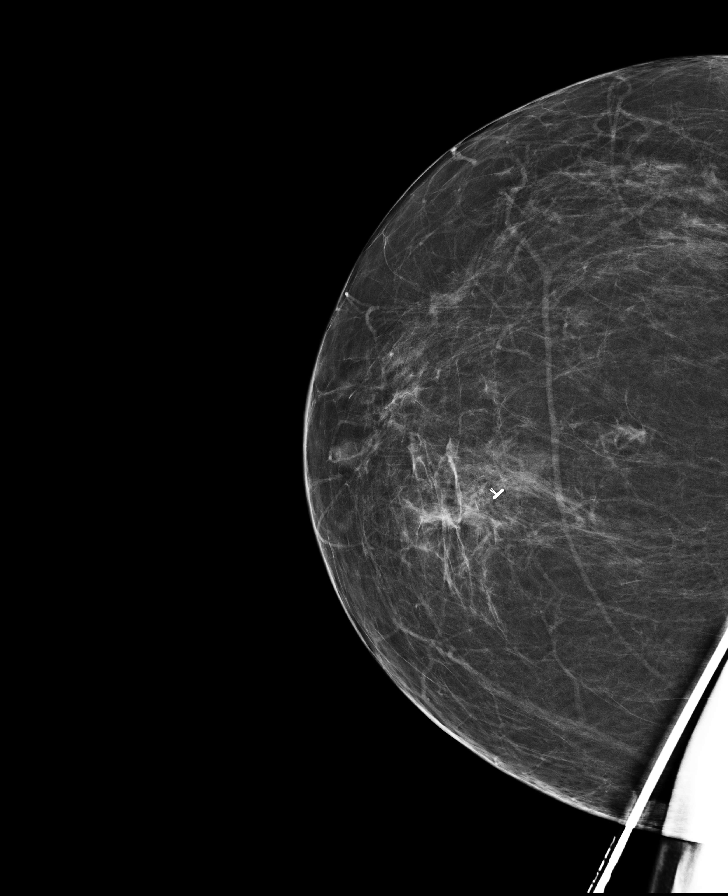

[R ML (3 of 5)]
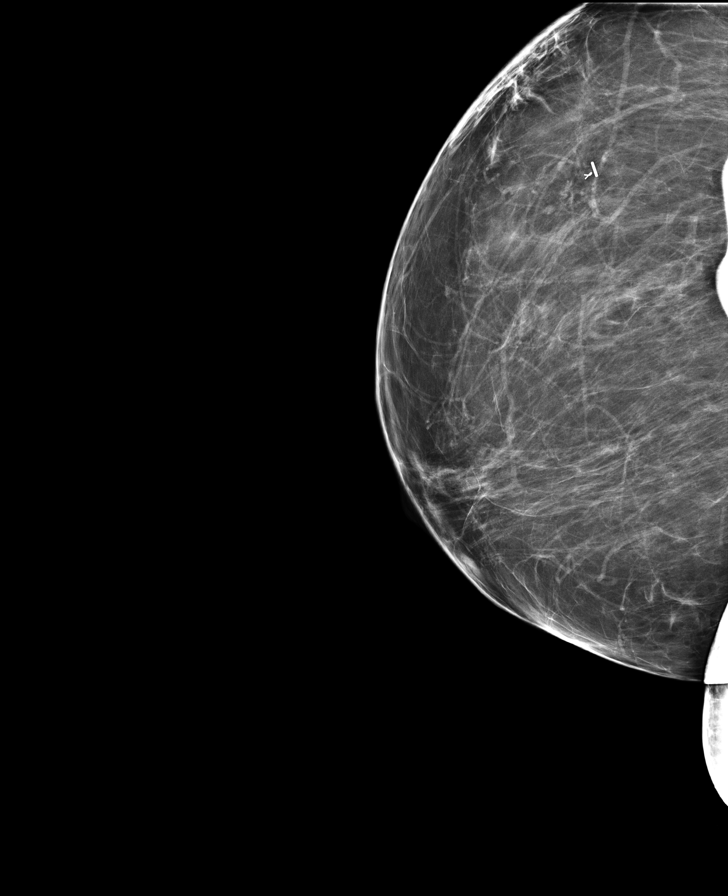

[R ML (4 of 5)]
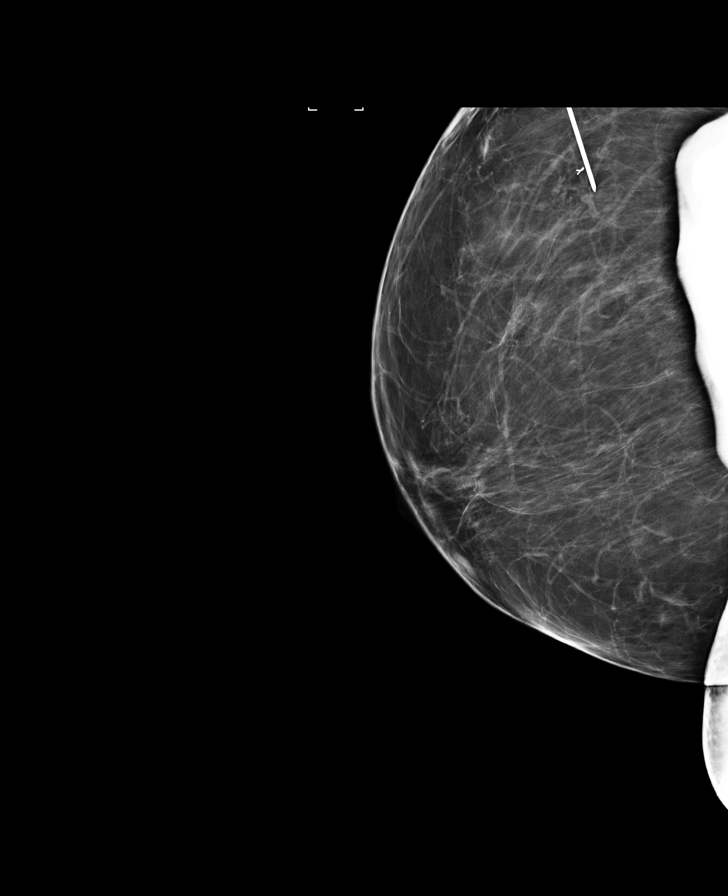

[R ML (5 of 5)]
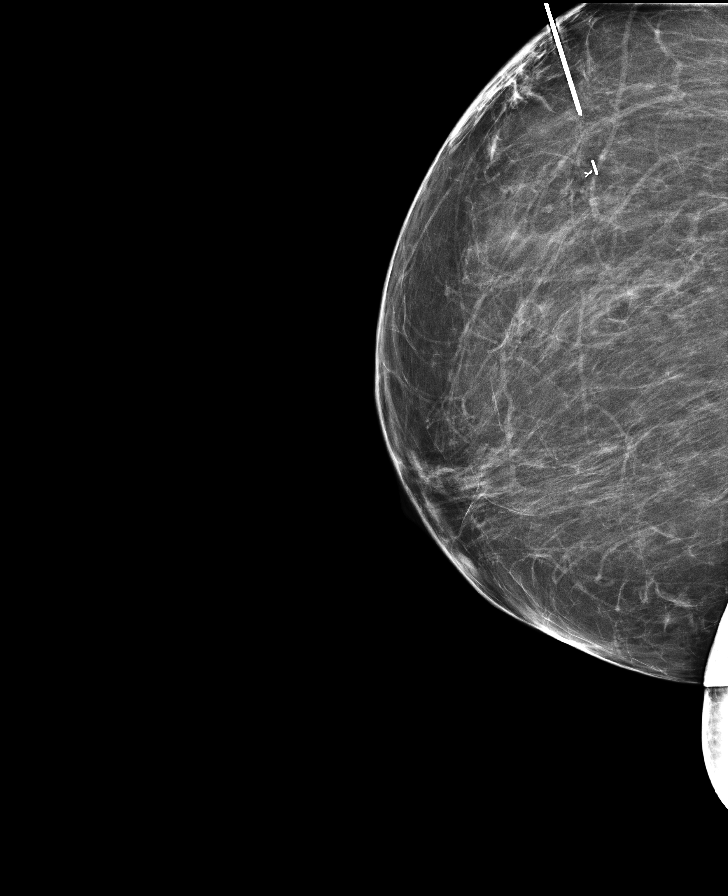

[R CC (3 of 3)]
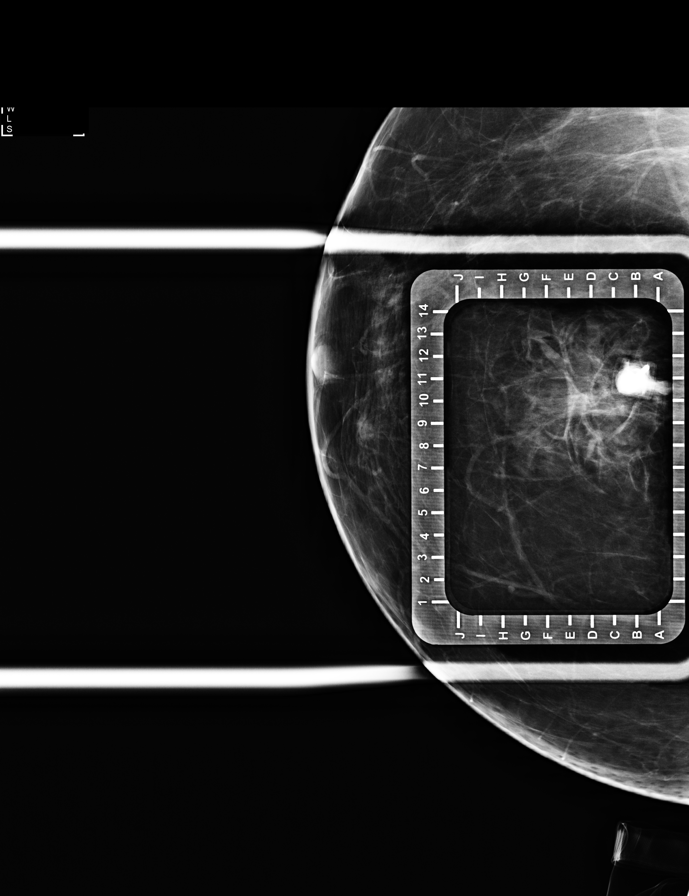

[8 of 8 positions shown; findings below may reference images not displayed]

FINDINGS: Patient presents for radioactive seed localization prior to
excision. I met with the patient and we discussed the procedure of
seed localization including benefits and alternatives. We discussed
the high likelihood of a successful procedure. We discussed the
risks of the procedure including infection, bleeding, tissue injury
and further surgery. We discussed the low dose of radioactivity
involved in the procedure. Informed, written consent was given.

The usual time-out protocol was performed immediately prior to the
procedure.

Using mammographic guidance, sterile technique, 1% lidocaine and an
[id number] radioactive seed, the ribbon shaped clip in the UPPER INNER
QUADRANT of the RIGHT breast was localized using a superior to
inferior approach. The follow-up mammogram images confirm the seed
in the expected location and were marked for Dr. Marron.

Follow-up survey of the patient confirms presence of the radioactive
seed.

Order number of [id number] seed:  [id number].

Total activity:  0.2 x 3 millicuries reference Date: 08/25/2018

The patient tolerated the procedure well and was released from the
[REDACTED]. She was given instructions regarding seed removal.
IMPRESSION: Radioactive seed localization of the RIGHT breast. No apparent
complications.

## 2019-04-14 IMAGING — DX BREAST SURGICAL SPECIMEN
1 series · 2 of 2 positions shown · non-contrast
Comparison: Previous exam(s).

CLINICAL DATA: Suspicious mass in the right breast for which the
biopsy was negative and found discordant. Surgical excision was
recommended.

EXAM:
SPECIMEN RADIOGRAPH OF THE RIGHT BREAST

[Series 2: specimen digital x-ray, derived · right · 2 of 2 slices shown]
[im 1/2]
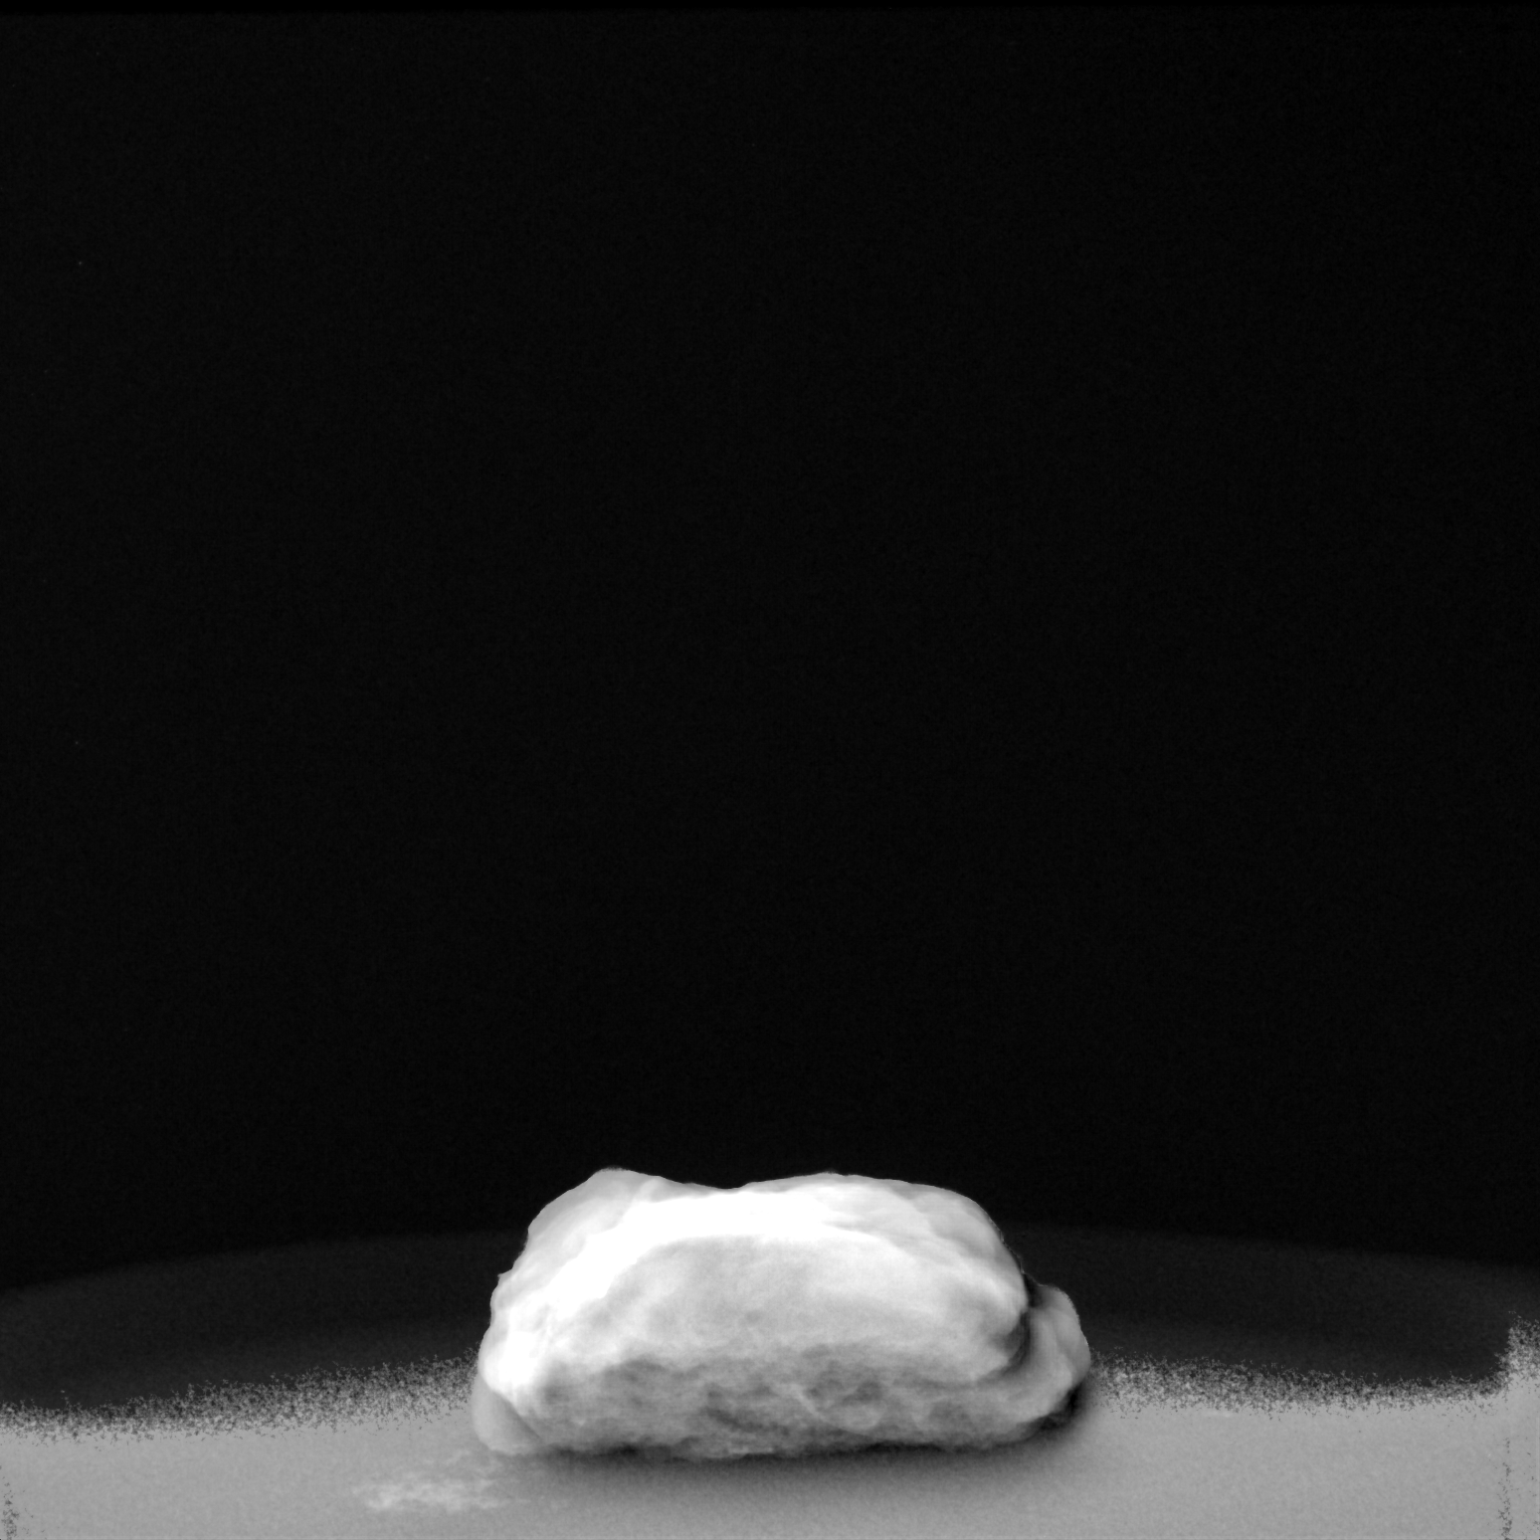
[im 2/2]
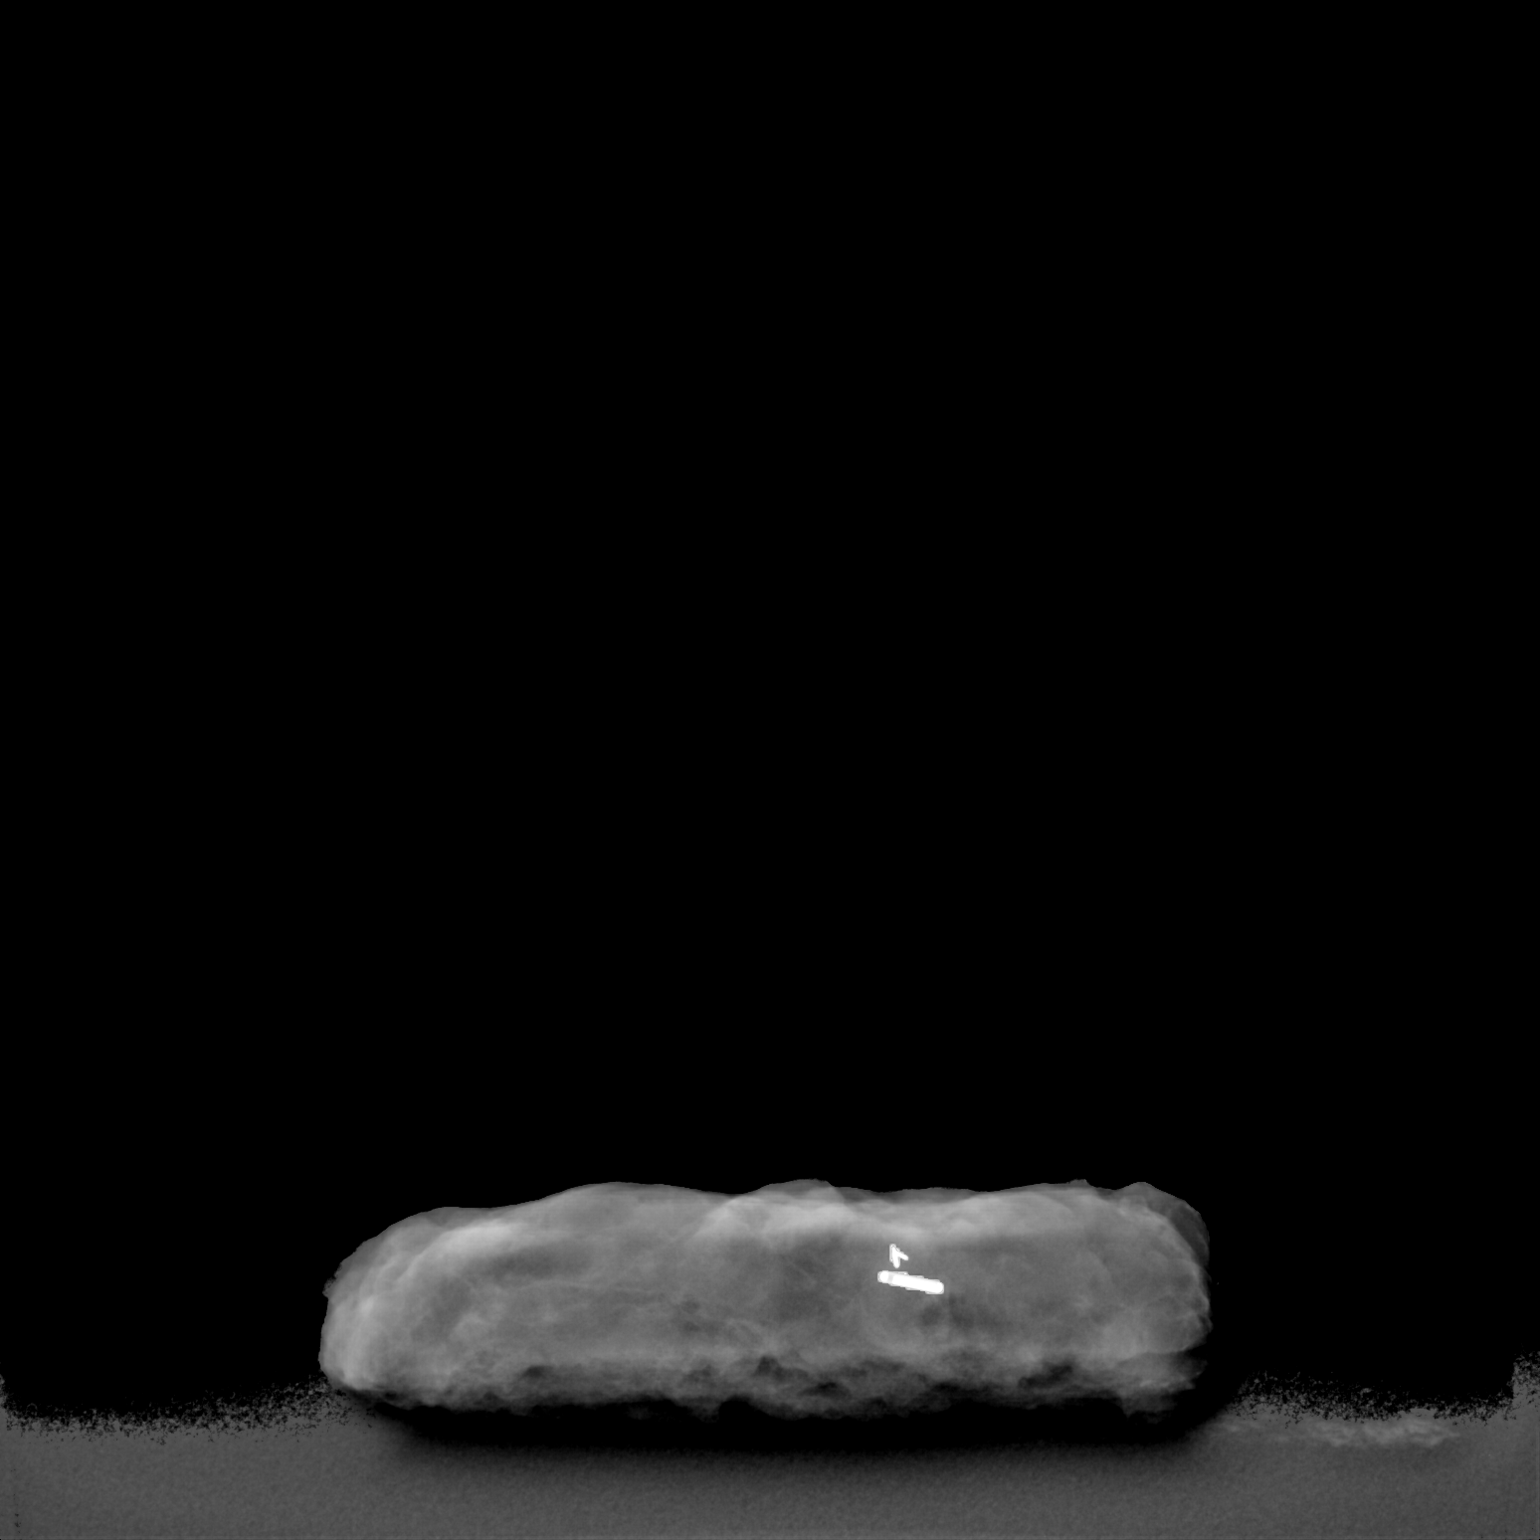

[2 of 2 positions shown; findings below may reference images not displayed]

FINDINGS: Status post excision of the right breast. The radioactive seed and
biopsy marker clip are present, completely intact, and were marked
for pathology.
IMPRESSION: Specimen radiograph of the right breast.
